# Patient Record
Sex: Male | Born: 2000 | Race: Black or African American | Hispanic: No | Marital: Single | Smoking: Never smoker
Health system: Southern US, Community
[De-identification: ages and names within clinical notes are randomized; demographics above are authoritative.]

## PROBLEM LIST (undated history)

## (undated) ENCOUNTER — Emergency Department (HOSPITAL_BASED_OUTPATIENT_CLINIC_OR_DEPARTMENT_OTHER): Admission: EM | Payer: BC Managed Care – PPO | Source: Home / Self Care

## (undated) DIAGNOSIS — J45909 Unspecified asthma, uncomplicated: Secondary | ICD-10-CM

---
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
## 2021-04-10 ENCOUNTER — Encounter (HOSPITAL_BASED_OUTPATIENT_CLINIC_OR_DEPARTMENT_OTHER): Payer: Self-pay

## 2021-04-10 ENCOUNTER — Emergency Department (HOSPITAL_BASED_OUTPATIENT_CLINIC_OR_DEPARTMENT_OTHER)
Admission: EM | Admit: 2021-04-10 | Discharge: 2021-04-10 | Disposition: A | Discharge status: Home / Self Care | Payer: BC Managed Care – PPO | Attending: Emergency Medicine | Admitting: Emergency Medicine

## 2021-04-10 ENCOUNTER — Other Ambulatory Visit: Payer: Self-pay

## 2021-04-10 DIAGNOSIS — J45909 Unspecified asthma, uncomplicated: Secondary | ICD-10-CM | POA: Diagnosis not present

## 2021-04-10 DIAGNOSIS — R1013 Epigastric pain: Secondary | ICD-10-CM | POA: Insufficient documentation

## 2021-04-10 DIAGNOSIS — R000 Tachycardia, unspecified: Secondary | ICD-10-CM | POA: Diagnosis not present

## 2021-04-10 DIAGNOSIS — R1110 Vomiting, unspecified: Secondary | ICD-10-CM | POA: Insufficient documentation

## 2021-04-10 DIAGNOSIS — R112 Nausea with vomiting, unspecified: Secondary | ICD-10-CM | POA: Diagnosis not present

## 2021-04-10 LAB — CBC WITH DIFFERENTIAL/PLATELET
Abs Immature Granulocytes: 0.04 10*3/uL (ref 0.00–0.07)
Basophils Absolute: 0 10*3/uL (ref 0.0–0.1)
Basophils Relative: 0 %
Eosinophils Absolute: 0 10*3/uL (ref 0.0–0.5)
Eosinophils Relative: 0 %
HCT: 47.3 % (ref 39.0–52.0)
Hemoglobin: 15.8 g/dL (ref 13.0–17.0)
Immature Granulocytes: 0 %
Lymphocytes Relative: 10 %
Lymphs Abs: 0.9 10*3/uL (ref 0.7–4.0)
MCH: 29.2 pg (ref 26.0–34.0)
MCHC: 33.4 g/dL (ref 30.0–36.0)
MCV: 87.4 fL (ref 80.0–100.0)
Monocytes Absolute: 0.4 10*3/uL (ref 0.1–1.0)
Monocytes Relative: 5 %
Neutro Abs: 7.7 10*3/uL (ref 1.7–7.7)
Neutrophils Relative %: 85 %
Platelets: 137 10*3/uL — ABNORMAL LOW (ref 150–400)
RBC: 5.41 MIL/uL (ref 4.22–5.81)
RDW: 12.3 % (ref 11.5–15.5)
WBC: 9.1 10*3/uL (ref 4.0–10.5)
nRBC: 0 % (ref 0.0–0.2)

## 2021-04-10 LAB — COMPREHENSIVE METABOLIC PANEL
ALT: 21 U/L (ref 0–44)
AST: 24 U/L (ref 15–41)
Albumin: 5 g/dL (ref 3.5–5.0)
Alkaline Phosphatase: 58 U/L (ref 38–126)
Anion gap: 12 (ref 5–15)
BUN: 13 mg/dL (ref 6–20)
CO2: 24 mmol/L (ref 22–32)
Calcium: 10 mg/dL (ref 8.9–10.3)
Chloride: 102 mmol/L (ref 98–111)
Creatinine, Ser: 1.23 mg/dL (ref 0.61–1.24)
GFR, Estimated: >60 mL/min (ref >60)
Glucose, Bld: 90 mg/dL (ref 70–99)
Potassium: 3.7 mmol/L (ref 3.5–5.1)
Sodium: 138 mmol/L (ref 135–145)
Total Bilirubin: 1.2 mg/dL (ref 0.3–1.2)
Total Protein: 8.2 g/dL — ABNORMAL HIGH (ref 6.5–8.1)

## 2021-04-10 LAB — LIPASE, BLOOD: Lipase: 19 U/L (ref 11–51)

## 2021-04-10 MED ORDER — ONDANSETRON HCL 4 MG/2ML IJ SOLN
4.0000 mg | Freq: Once | INTRAMUSCULAR | Status: AC
  Administered 2021-04-10: 01:00:00 4 mg via INTRAVENOUS
  Filled 2021-04-10: qty 2

## 2021-04-10 MED ORDER — LACTATED RINGERS IV BOLUS
1000.0000 mL | Freq: Once | INTRAVENOUS | Status: AC
  Administered 2021-04-10: 01:00:00 1000 mL via INTRAVENOUS

## 2021-04-10 MED ORDER — ONDANSETRON 4 MG PO TBDP: 4.0000 mg | ORAL_TABLET | Freq: Three times a day (TID) | ORAL | 0 refills | Status: AC | PRN

## 2021-04-10 NOTE — ED Triage Notes (Signed)
Vomiting with some nausea since around 1 pm, but mom states he has been having these symptoms intermittently for 2 weeks.

## 2021-04-10 NOTE — ED Provider Notes (Signed)
[facility] EMERGENCY DEPT Provider Note   CSN: [id number] Arrival date & time: 04/10/21  0007     History Chief Complaint  Patient presents with   Emesis    Kevin Edwards is a 20 y.o. male.  The history is provided by the patient.  Emesis Severity:  Severe Duration: 2 weeks. Timing:  Sporadic Number of daily episodes:  Has had intermittent vomiting over the last 2 weeks.  would vomit and then go 2-3 days and then vomit again.  but today more than 10 episodes of emesis since 1pm Quality:  Stomach contents (stomach acid) Progression:  Unchanged Chronicity:  New Recent urination:  Normal Relieved by:  None tried Worsened by:  Liquids and food smell Ineffective treatments:  None tried Associated symptoms: no cough, no diarrhea, no fever and no URI   Associated symptoms comment:  Mild upper abd cramping and discomfort.  No cough, congestion, fever, SOB or chest pain.  No diarrhea.  No EtOH, drugs, marijuana or tobacco.  No hx of similar.  No urinary complaints but mild lower back pain after all the vomiting. Risk factors: no alcohol use, no prior abdominal surgery, no sick contacts, no suspect food intake and no travel to endemic areas       Past Medical History:  Diagnosis Date   Asthma     There are no problems to display for this patient.   History reviewed. No pertinent surgical history.     No family history on file.  Social History   Tobacco Use   Smoking status: Never   Smokeless tobacco: Never  Substance Use Topics   Alcohol use: No   Drug use: No    Home Medications Prior to Admission medications   Medication Sig Start Date End Date Taking? Authorizing Provider  ondansetron (ZOFRAN-ODT) 4 MG disintegrating tablet Take 1 tablet (4 mg total) by mouth every 8 (eight) hours as needed for nausea or vomiting. 04/10/21  Yes Gwyneth Sprout, MD  cetirizine (ZYRTEC) 10 MG tablet Take 1 tablet (10 mg total) by mouth daily. For 3 days  04/13/17   Ree Shay, MD  diphenhydrAMINE (BENADRYL) 25 MG tablet Take 1 tablet (25 mg total) by mouth every 6 (six) hours. As needed for headache 10/13/15   Ree Shay, MD  ibuprofen (ADVIL,MOTRIN) 600 MG tablet Take 1 tablet (600 mg total) by mouth every 6 (six) hours as needed. 10/13/15   Ree Shay, MD  loratadine (CLARITIN) 10 MG tablet Take 10 mg by mouth daily.    [provider]  ondansetron (ZOFRAN) 4 MG tablet Take 1 tablet (4 mg total) by mouth every 8 (eight) hours as needed for nausea or vomiting. 09/04/20   Horton, Clabe Seal, DO  predniSONE (DELTASONE) 20 MG tablet Take 3 tablets (60 mg total) by mouth daily. For 3 more days 04/13/17   Ree Shay, MD    Allergies    Patient has no known allergies.  Review of Systems   Review of Systems  Constitutional:  Negative for fever.  Respiratory:  Negative for cough.   Gastrointestinal:  Positive for vomiting. Negative for diarrhea.  All other systems reviewed and are negative.  Physical Exam Updated Vital Signs BP 129/88    Pulse 91    Temp 97.9 F (36.6 C) (Oral)    Resp 18    Ht 5\' 10"  (1.778 m)    Wt 87.5 kg    SpO2 100%    BMI 27.69 kg/m   Physical Exam Vitals  and nursing note reviewed.  Constitutional:      General: He is not in acute distress.    Appearance: He is well-developed.  HENT:     Head: Normocephalic and atraumatic.     Mouth/Throat:     Mouth: Mucous membranes are moist.  Eyes:     Conjunctiva/sclera: Conjunctivae normal.     Pupils: Pupils are equal, round, and reactive to light.  Cardiovascular:     Rate and Rhythm: Regular rhythm. Tachycardia present.     Pulses: Normal pulses.     Heart sounds: No murmur heard. Pulmonary:     Effort: Pulmonary effort is normal. No respiratory distress.     Breath sounds: Normal breath sounds. No wheezing or rales.  Abdominal:     General: There is no distension.     Palpations: Abdomen is soft.     Tenderness: There is abdominal tenderness in the  epigastric area. There is no right CVA tenderness, left CVA tenderness, guarding or rebound.  Musculoskeletal:        General: No tenderness. Normal range of motion.     Cervical back: Normal range of motion and neck supple.  Skin:    General: Skin is warm and dry.     Findings: No erythema or rash.  Neurological:     Mental Status: He is alert and oriented to person, place, and time. Mental status is at baseline.  Psychiatric:        Mood and Affect: Mood normal.        Behavior: Behavior normal.    ED Results / Procedures / Treatments   Labs (all labs ordered are listed, but only abnormal results are displayed) Labs Reviewed  CBC WITH DIFFERENTIAL/PLATELET - Abnormal; Notable for the following components:      Result Value   Platelets 137 (*)    All other components within normal limits  COMPREHENSIVE METABOLIC PANEL - Abnormal; Notable for the following components:   Total Protein 8.2 (*)    All other components within normal limits  LIPASE, BLOOD  URINALYSIS, ROUTINE W REFLEX MICROSCOPIC    EKG None  Radiology No results found.  Procedures Procedures   Medications Ordered in ED Medications  ondansetron (ZOFRAN) injection 4 mg (4 mg Intravenous Given 04/10/21 0049)  lactated ringers bolus 1,000 mL (0 mLs Intravenous Stopped 04/10/21 0121)    ED Course  I have reviewed the triage vital signs and the nursing notes.  Pertinent labs & imaging results that were available during my care of the patient were reviewed by me and considered in my medical decision making (see chart for details).    MDM Rules/Calculators/A&P                         Pt with intermittent vomiting over the last 2 weeks.  No significant pain here except for mild epigastric discomfort.  No fever or diarrhea.  No prior hx of similar.  Pt does not use marijuana or take any supplements.  Unknown food irritant.  Reports over the last 2 weeks he has lost 4lbs.  No meds.  Low suspicion for hepatitis,  appy or diverticulitis.  Low suspicion for pulm or cardiac cause.  Denies sx of GERD.  Concern for dehydration vs pancreatitis vs cyclical vomiting or food allergy.  Will given IVF and zofran.  Labs pending.  2:27 AM Labs reassuring.  Pt tolerating po's.  Will d/c but to f/u with GI.  MDM  Amount and/or Complexity of Data Reviewed Clinical lab tests: ordered and reviewed Independent visualization of images, tracings, or specimens: yes  Patient Progress Patient progress: stable      Final Clinical Impression(s) / ED Diagnoses Final diagnoses:  Recurrent vomiting    Rx / DC Orders ED Discharge Orders          Ordered    ondansetron (ZOFRAN-ODT) 4 MG disintegrating tablet  Every 8 hours PRN        04/10/21 0225             Gwyneth Sprout, MD 04/10/21 [phone]

## 2021-04-10 NOTE — Discharge Instructions (Signed)
All the labs were normal.  Try a very bland diet at first with broth, jello, applesauce, gatorade and slowly progress back to normal.  Try to keep a food diary.

## 2021-05-22 DIAGNOSIS — R6882 Decreased libido: Secondary | ICD-10-CM | POA: Diagnosis not present

## 2021-05-26 DIAGNOSIS — Z6828 Body mass index (BMI) 28.0-28.9, adult: Secondary | ICD-10-CM | POA: Diagnosis not present

## 2021-05-26 DIAGNOSIS — Z79899 Other long term (current) drug therapy: Secondary | ICD-10-CM | POA: Diagnosis not present

## 2021-05-26 DIAGNOSIS — Z7251 High risk heterosexual behavior: Secondary | ICD-10-CM | POA: Diagnosis not present

## 2021-05-26 DIAGNOSIS — Z202 Contact with and (suspected) exposure to infections with a predominantly sexual mode of transmission: Secondary | ICD-10-CM | POA: Diagnosis not present

## 2021-09-03 DIAGNOSIS — M9905 Segmental and somatic dysfunction of pelvic region: Secondary | ICD-10-CM | POA: Diagnosis not present

## 2021-09-03 DIAGNOSIS — M7651 Patellar tendinitis, right knee: Secondary | ICD-10-CM | POA: Diagnosis not present

## 2021-09-03 DIAGNOSIS — M9902 Segmental and somatic dysfunction of thoracic region: Secondary | ICD-10-CM | POA: Diagnosis not present

## 2021-09-03 DIAGNOSIS — M9903 Segmental and somatic dysfunction of lumbar region: Secondary | ICD-10-CM | POA: Diagnosis not present

## 2021-09-03 DIAGNOSIS — M9906 Segmental and somatic dysfunction of lower extremity: Secondary | ICD-10-CM | POA: Diagnosis not present

## 2021-09-19 ENCOUNTER — Other Ambulatory Visit: Payer: Self-pay

## 2021-09-19 ENCOUNTER — Encounter (HOSPITAL_BASED_OUTPATIENT_CLINIC_OR_DEPARTMENT_OTHER): Payer: Self-pay | Admitting: *Deleted

## 2021-09-19 DIAGNOSIS — Y9361 Activity, american tackle football: Secondary | ICD-10-CM | POA: Diagnosis not present

## 2021-09-19 DIAGNOSIS — J45909 Unspecified asthma, uncomplicated: Secondary | ICD-10-CM | POA: Diagnosis not present

## 2021-09-19 DIAGNOSIS — M79604 Pain in right leg: Secondary | ICD-10-CM | POA: Insufficient documentation

## 2021-09-19 NOTE — ED Triage Notes (Signed)
Pt has pain in right thigh since pulling it during football practice today.  Pt ambulatory with a limp.

## 2021-09-20 ENCOUNTER — Emergency Department (HOSPITAL_BASED_OUTPATIENT_CLINIC_OR_DEPARTMENT_OTHER)
Admission: EM | Admit: 2021-09-20 | Discharge: 2021-09-20 | Disposition: A | Discharge status: Home / Self Care | Payer: BC Managed Care – PPO | Attending: Emergency Medicine | Admitting: Emergency Medicine

## 2021-09-20 ENCOUNTER — Emergency Department (HOSPITAL_BASED_OUTPATIENT_CLINIC_OR_DEPARTMENT_OTHER): Payer: BC Managed Care – PPO | Admitting: Radiology

## 2021-09-20 DIAGNOSIS — M79651 Pain in right thigh: Secondary | ICD-10-CM | POA: Diagnosis not present

## 2021-09-20 DIAGNOSIS — M79604 Pain in right leg: Secondary | ICD-10-CM | POA: Diagnosis not present

## 2021-09-20 NOTE — Discharge Instructions (Signed)
You were evaluated in the Emergency Department and after careful evaluation, we did not find any emergent condition requiring admission or further testing in the hospital.  Your exam/testing today was overall reassuring.  X-ray did not show any abnormalities or fractures.  Suspect you have a lingering hamstring injury.  Recommend continued use of Tylenol, Motrin for pain.  Recommend follow-up with the orthopedic specialists for future management.  Please return to the Emergency Department if you experience any worsening of your condition.  Thank you for allowing [place] to be a part of your care.

## 2021-09-20 NOTE — ED Provider Notes (Signed)
[facility]ERGENCY [facility] Emergency Department Provider Note MRN:  [id number]  Arrival date & time: 09/20/21     Chief Complaint   Leg Injury   History of Present Illness   Kevin Edwards is a 21 y.o. year-old male with no pertinent past medical history presenting to the ED with chief complaint of leg injury.  Pain to the right hamstring for the past 2 weeks.  Initially hurt it while trying to run fast during football.  Tried to rehab it slowly over the past 2 weeks.  Tried to run a little bit faster today and reaggravated the injury.  No fall or trauma or car accidents recently, no other pain or complaints.  Review of Systems  A thorough review of systems was obtained and all systems are negative except as noted in the HPI and PMH.   Patient's Health History    Past Medical History:  Diagnosis Date   Asthma     History reviewed. No pertinent surgical history.  No family history on file.  Social History   Socioeconomic History   Marital status: Single    Spouse name: Not on file   Number of children: Not on file   Years of education: Not on file   Highest education level: Not on file  Occupational History   Not on file  Tobacco Use   Smoking status: Never   Smokeless tobacco: Never  Substance and Sexual Activity   Alcohol use: No   Drug use: No   Sexual activity: Not on file  Other Topics Concern   Not on file  Social History Narrative   Not on file   Social Determinants of Health   Financial Resource Strain: Not on file  Food Insecurity: Not on file  Transportation Needs: Not on file  Physical Activity: Not on file  Stress: Not on file  Social Connections: Not on file  Intimate Partner Violence: Not on file     Physical Exam   Vitals:   09/19/21 2344  BP: (!) 147/107  Pulse: 80  Resp: 14  Temp: 98 F (36.7 C)  SpO2: 99%    CONSTITUTIONAL: Well-appearing, NAD NEURO/PSYCH:  Alert and oriented x 3, no focal deficits EYES:  eyes equal  and reactive ENT/NECK:  no LAD, no JVD CARDIO: Regular rate, well-perfused, normal S1 and S2 PULM:  CTAB no wheezing or rhonchi GI/GU:  non-distended, non-tender MSK/SPINE:  No gross deformities, no edema, neurovascular intact lower extremities, some tenderness to the right hamstring musculature, largely preserved range of motion of the extremity SKIN:  no rash, atraumatic   *Additional and/or pertinent findings included in MDM below  Diagnostic and Interventional Summary    EKG Interpretation  Date/Time:    Ventricular Rate:    PR Interval:    QRS Duration:   QT Interval:    QTC Calculation:   R Axis:     Text Interpretation:         Labs Reviewed - No data to display  DG Femur Min 2 Views Right  Final Result      Medications - No data to display   Procedures  /  Critical Care Procedures  ED Course and Medical Decision Making  Initial Impression and Ddx Suspect hamstring injury, given duration of pain x-ray obtained to exclude signs of bony abnormality.  Past medical/surgical history that increases complexity of ED encounter: None  Interpretation of Diagnostics I personally reviewed the femur x-ray and my interpretation is as follows: No obvious  fracture or bony abnormality    Patient Reassessment and Ultimate Disposition/Management     Appropriate for discharge home with referral to sports medicine  Patient management required discussion with the following services or consulting groups:  None  Complexity of Problems Addressed Acute complicated illness or Injury  Additional Data Reviewed and Analyzed Further history obtained from: Further history from spouse/family member  Additional Factors Impacting ED Encounter Risk None  Elmer Sow. Pilar Plate, MD [facility] Emergency Medicine [facility] [email or web address]  Final Clinical Impressions(s) / ED Diagnoses     ICD-10-CM   1. Pain of right lower extremity  M79.604       ED  Discharge Orders     None        Discharge Instructions Discussed with and Provided to Patient:    Discharge Instructions      You were evaluated in the Emergency Department and after careful evaluation, we did not find any emergent condition requiring admission or further testing in the hospital.  Your exam/testing today was overall reassuring.  X-ray did not show any abnormalities or fractures.  Suspect you have a lingering hamstring injury.  Recommend continued use of Tylenol, Motrin for pain.  Recommend follow-up with the orthopedic specialists for future management.  Please return to the Emergency Department if you experience any worsening of your condition.  Thank you for allowing [place] to be a part of your care.       Sabas Sous, MD 09/20/21 [phone]

## 2021-10-28 DIAGNOSIS — A630 Anogenital (venereal) warts: Secondary | ICD-10-CM | POA: Diagnosis not present

## 2021-12-16 DIAGNOSIS — Z202 Contact with and (suspected) exposure to infections with a predominantly sexual mode of transmission: Secondary | ICD-10-CM | POA: Diagnosis not present

## 2021-12-16 DIAGNOSIS — Z03818 Encounter for observation for suspected exposure to other biological agents ruled out: Secondary | ICD-10-CM | POA: Diagnosis not present

## 2022-03-05 DIAGNOSIS — S82891D Other fracture of right lower leg, subsequent encounter for closed fracture with routine healing: Secondary | ICD-10-CM | POA: Diagnosis not present

## 2022-03-05 DIAGNOSIS — M25512 Pain in left shoulder: Secondary | ICD-10-CM | POA: Diagnosis not present

## 2022-04-09 DIAGNOSIS — M25512 Pain in left shoulder: Secondary | ICD-10-CM | POA: Diagnosis not present

## 2022-04-18 DIAGNOSIS — M25512 Pain in left shoulder: Secondary | ICD-10-CM | POA: Diagnosis not present

## 2022-12-10 DIAGNOSIS — M24811 Other specific joint derangements of right shoulder, not elsewhere classified: Secondary | ICD-10-CM | POA: Diagnosis not present

## 2023-03-24 DIAGNOSIS — M24811 Other specific joint derangements of right shoulder, not elsewhere classified: Secondary | ICD-10-CM | POA: Diagnosis not present

## 2023-03-28 IMAGING — DX DG FEMUR 2+V*R*
4 series · 4 of 4 positions shown · non-contrast
Comparison: None Available.

CLINICAL DATA: hamstring pain x 2 weeks

EXAM:
RIGHT FEMUR 2 VIEWS

[femur ap (1 of 2)]
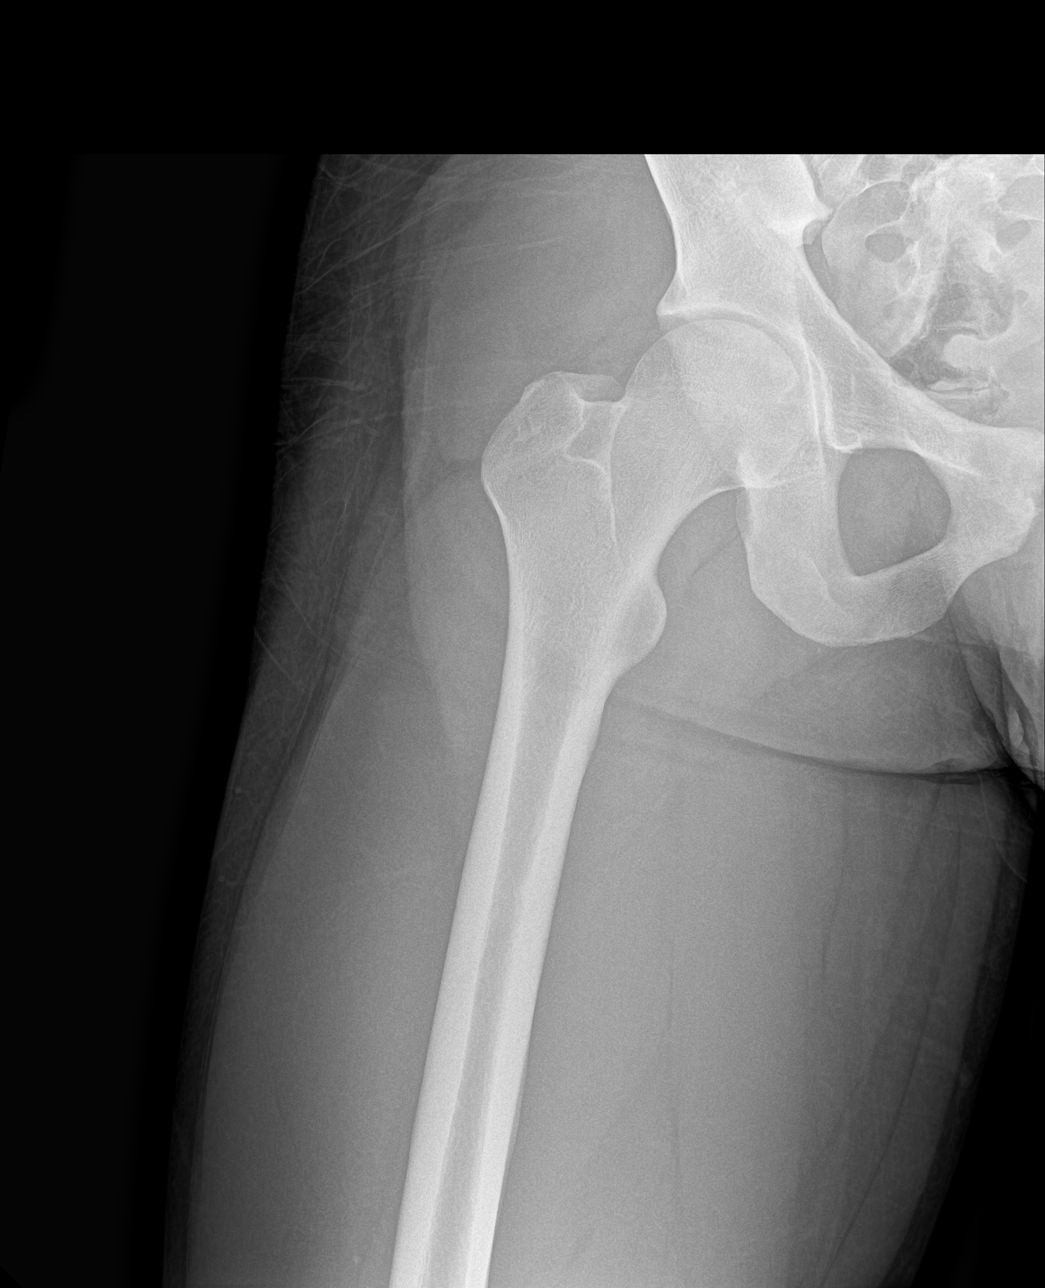

[femur ap (2 of 2)]
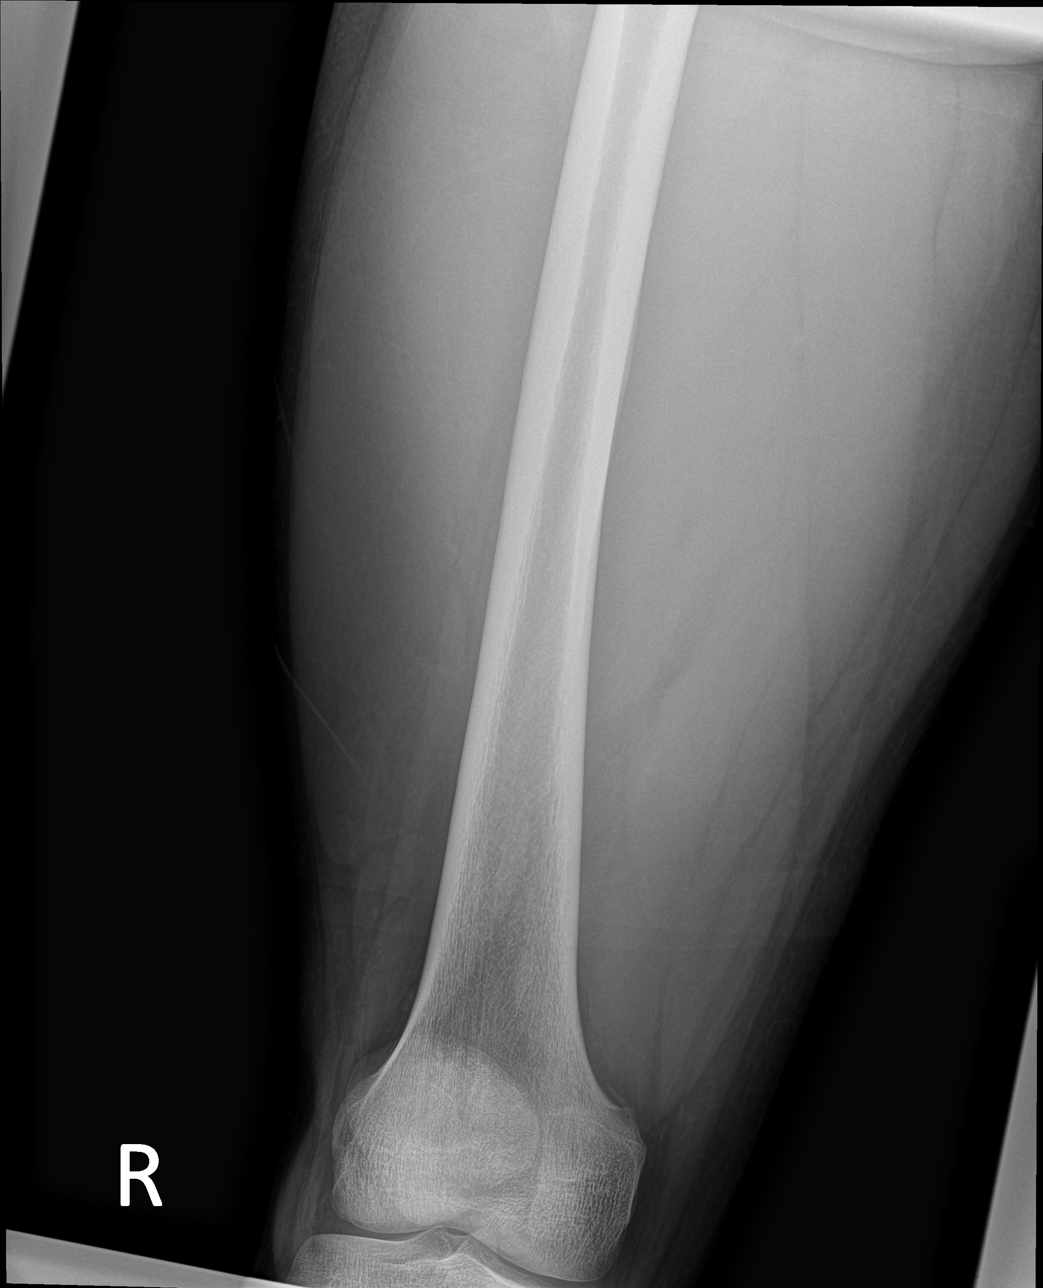

[femur lat (1 of 2)]
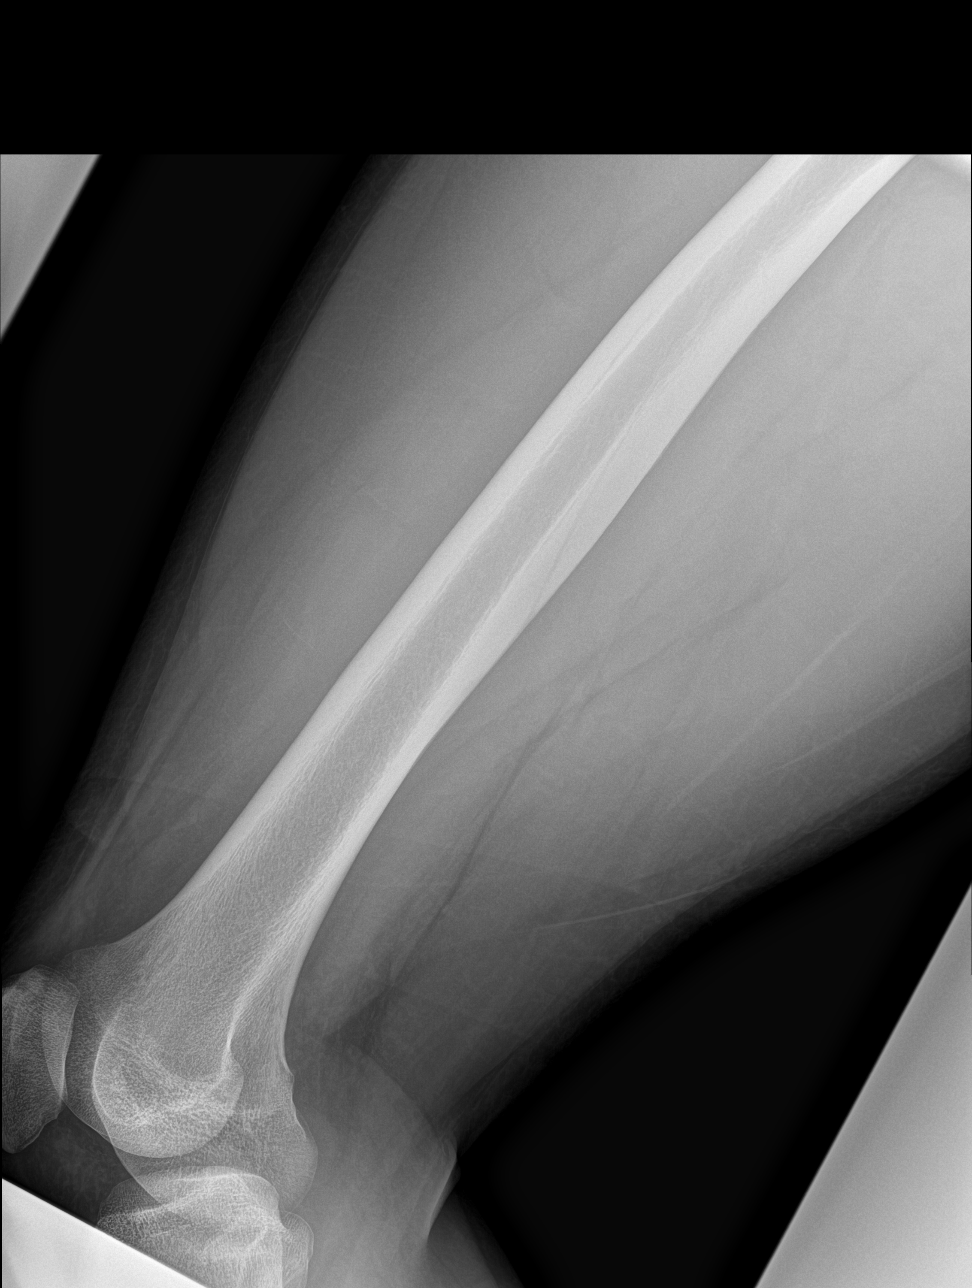

[femur lat (2 of 2)]
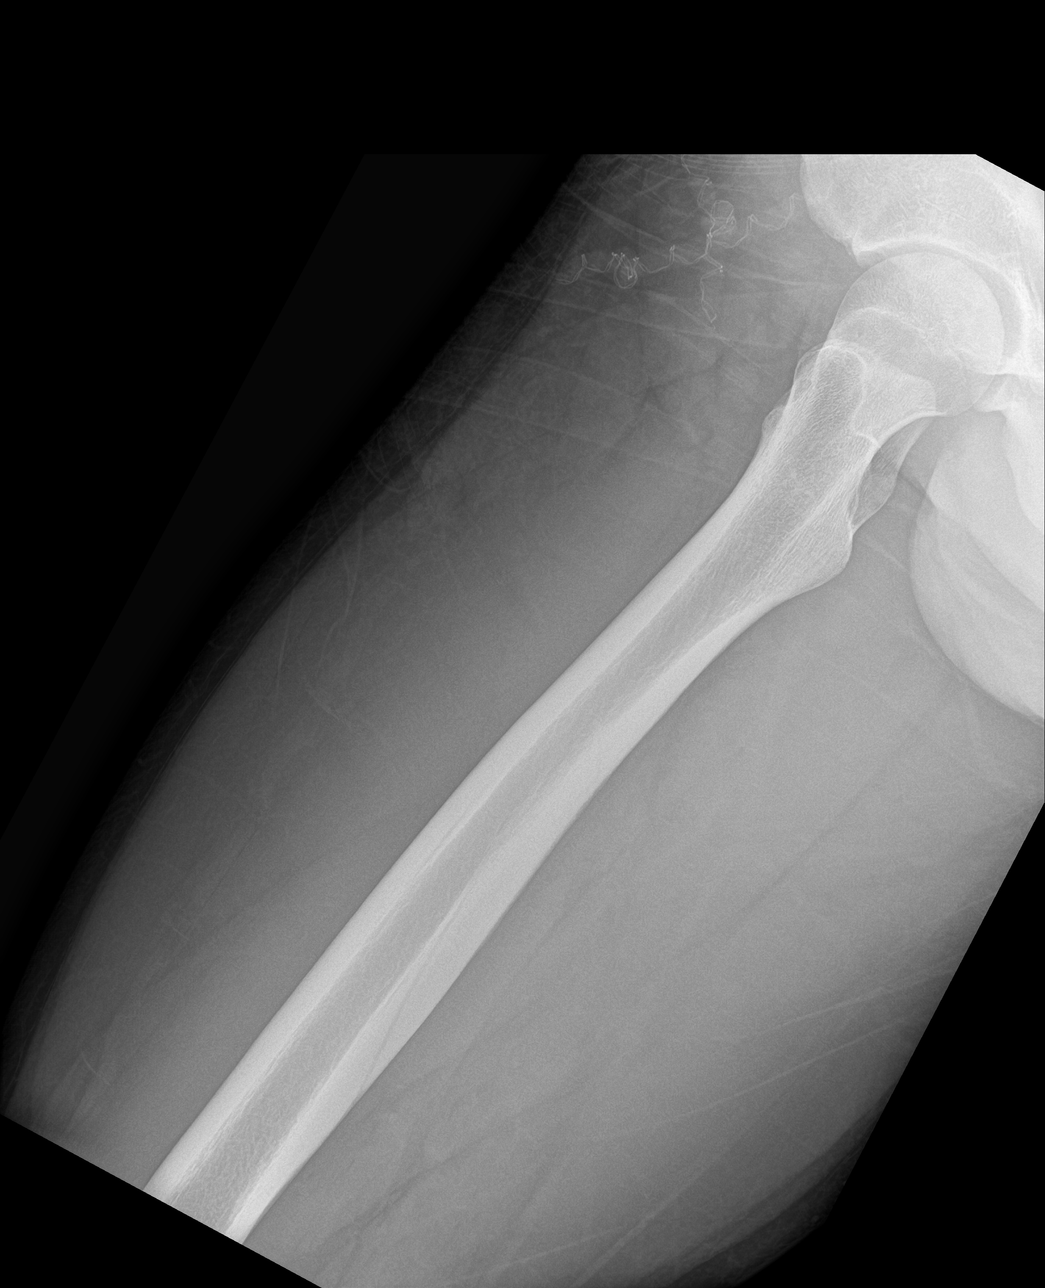

[4 of 4 positions shown; findings below may reference images not displayed]

FINDINGS: There is no evidence of fracture or other focal bone lesions. Soft
tissues are unremarkable.
IMPRESSION: Negative.

## 2023-10-02 DIAGNOSIS — Z111 Encounter for screening for respiratory tuberculosis: Secondary | ICD-10-CM | POA: Diagnosis not present

## 2023-10-04 DIAGNOSIS — Z111 Encounter for screening for respiratory tuberculosis: Secondary | ICD-10-CM | POA: Diagnosis not present
